# Patient Record
Sex: Male | Born: 1965 | Hispanic: No
Health system: Southern US, Community
[De-identification: ages and names within clinical notes are randomized; demographics above are authoritative.]

---

## 2007-10-16 ENCOUNTER — Encounter: Admission: RE | Admit: 2007-10-16 | Discharge: 2007-10-16 | Payer: Self-pay | Admitting: Orthopedic Surgery

## 2008-12-26 IMAGING — RF DG FLUORO GUIDE NDL PLC/BX
3 series · 3 of 3 positions shown · non-contrast
Comparison: none

CLINICAL DATA: Right shoulder pain

[Series 1: (hospital) · 1 of 1 slices shown]
[im 1/1]
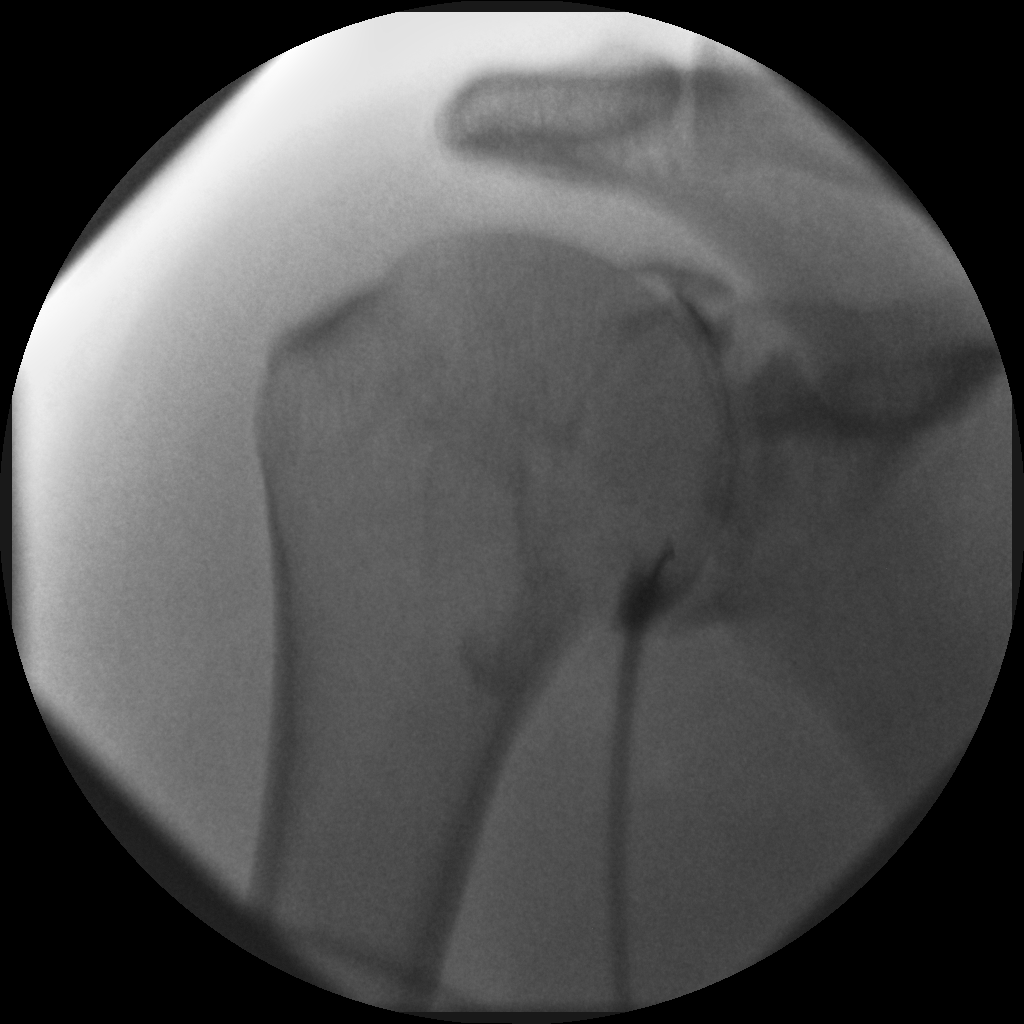

[Series 2: arthrogram · 1 of 1 slices shown (1 of 2)]
[im 1/1]
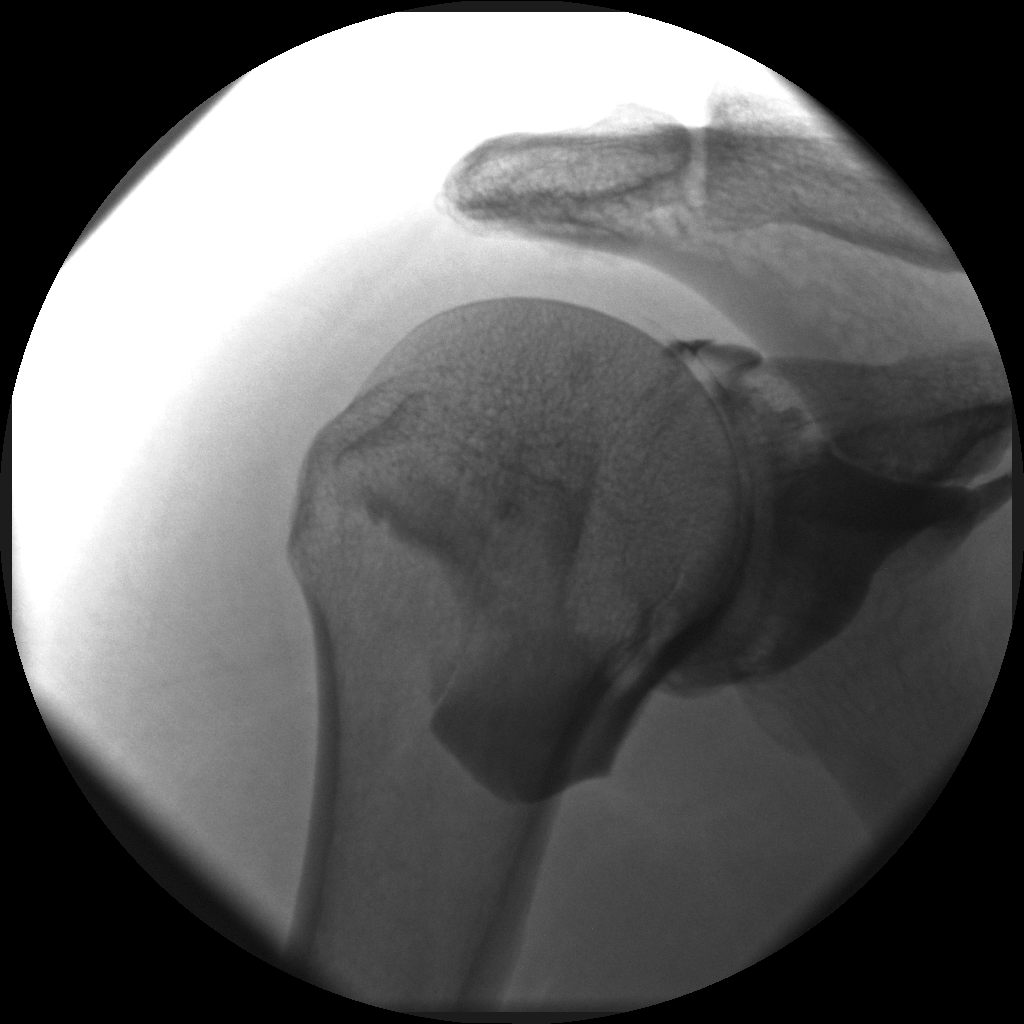

[Series 3: arthrogram · 1 of 1 slices shown (2 of 2)]
[im 1/1]
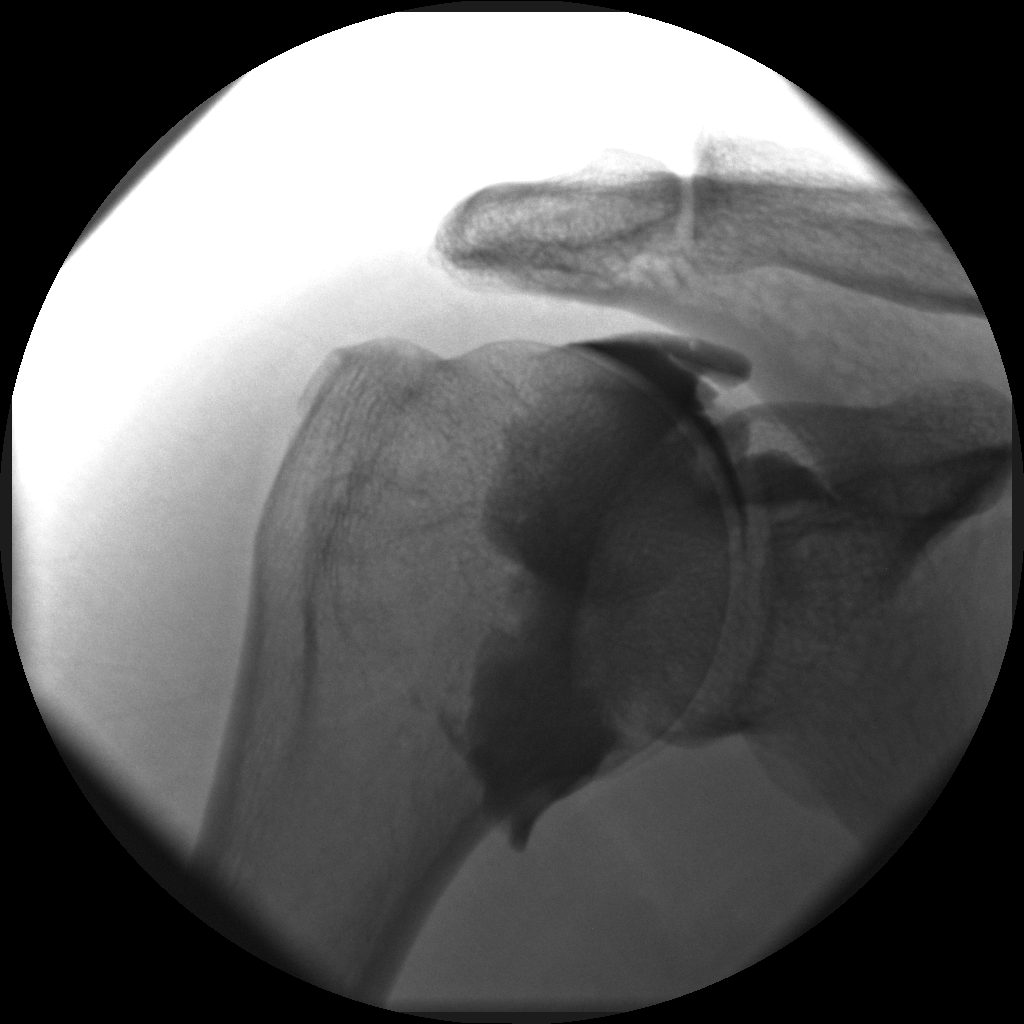

[3 of 3 positions shown; findings below may reference images not displayed]

Right Shoulder injection for MRI:

An appropriate skin entry site was determined under fluoroscopy. Skin site was
marked, prepped with Betadine, and draped in usual sterile fashion, and
infiltrated locally with 1% lidocaine.
22-gauge spinal needle advanced to the inferior medial margin of the humeral
head. 1 mL of lidocaine 1% injected easily. iodinated contrast with 0.1 mL
Omniscan was injected into the shoulder joint. Patient transferred to MRI.
IMPRESSION: 1. Technically successful right shoulder injection for MRI

## 2016-03-05 ENCOUNTER — Telehealth: Payer: Self-pay | Admitting: Neurology

## 2016-03-05 ENCOUNTER — Encounter: Payer: Self-pay | Admitting: Neurology

## 2016-03-05 NOTE — Telephone Encounter (Signed)
This patient did not show for an EMG appointment today. 

## 2016-03-06 ENCOUNTER — Encounter: Payer: Self-pay | Admitting: Neurology
